# Patient Record
Sex: Female | Born: 1980 | Race: Black or African American | Hispanic: No | Marital: Single | Smoking: Never smoker
Health system: Southern US, Community
[De-identification: ages and names within clinical notes are randomized; demographics above are authoritative.]

## PROBLEM LIST (undated history)

## (undated) DIAGNOSIS — I10 Essential (primary) hypertension: Secondary | ICD-10-CM

## (undated) DIAGNOSIS — F419 Anxiety disorder, unspecified: Secondary | ICD-10-CM

## (undated) HISTORY — PX: CERVICAL CERCLAGE: SHX1329

---

## 2006-09-11 ENCOUNTER — Emergency Department (HOSPITAL_COMMUNITY): Admission: EM | Admit: 2006-09-11 | Discharge: 2006-09-12 | Payer: Self-pay | Admitting: Emergency Medicine
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
## 2021-08-31 ENCOUNTER — Other Ambulatory Visit: Payer: Self-pay

## 2021-08-31 ENCOUNTER — Encounter (HOSPITAL_BASED_OUTPATIENT_CLINIC_OR_DEPARTMENT_OTHER): Payer: Self-pay | Admitting: Emergency Medicine

## 2021-08-31 DIAGNOSIS — R070 Pain in throat: Secondary | ICD-10-CM | POA: Diagnosis not present

## 2021-08-31 DIAGNOSIS — R1110 Vomiting, unspecified: Secondary | ICD-10-CM | POA: Diagnosis not present

## 2021-08-31 DIAGNOSIS — R519 Headache, unspecified: Secondary | ICD-10-CM | POA: Diagnosis not present

## 2021-08-31 DIAGNOSIS — Z5321 Procedure and treatment not carried out due to patient leaving prior to being seen by health care provider: Secondary | ICD-10-CM | POA: Insufficient documentation

## 2021-08-31 DIAGNOSIS — M791 Myalgia, unspecified site: Secondary | ICD-10-CM | POA: Diagnosis not present

## 2021-08-31 DIAGNOSIS — R059 Cough, unspecified: Secondary | ICD-10-CM | POA: Diagnosis not present

## 2021-08-31 NOTE — ED Triage Notes (Addendum)
Reports she started feeling bad today.  Generalized body aches, headache, cough, as well as a sore throat.  Vomited X 10 today.  Reports after a coughing spell she vomits.  [id number] in triage.  Patient reports she had a negative covid test at [facility] prior to arrival here.  Left due to long wait.

## 2021-09-01 ENCOUNTER — Emergency Department (HOSPITAL_BASED_OUTPATIENT_CLINIC_OR_DEPARTMENT_OTHER)
Admission: EM | Admit: 2021-09-01 | Discharge: 2021-09-01 | Disposition: A | Discharge status: Home / Self Care | Payer: Managed Care, Other (non HMO) | Attending: Emergency Medicine | Admitting: Emergency Medicine

## 2021-09-01 HISTORY — DX: Essential (primary) hypertension: I10

## 2021-09-01 HISTORY — DX: Anxiety disorder, unspecified: F41.9

## 2021-09-01 NOTE — ED Notes (Signed)
No answer after call for x3 in lobby
